# Patient Record
Sex: Male | Born: 1945 | Race: White | Hispanic: No | Marital: Married | Smoking: Former smoker
Health system: Southern US, Community
[De-identification: ages and names within clinical notes are randomized; demographics above are authoritative.]

## PROBLEM LIST (undated history)

## (undated) DIAGNOSIS — E785 Hyperlipidemia, unspecified: Secondary | ICD-10-CM

## (undated) DIAGNOSIS — K4090 Unilateral inguinal hernia, without obstruction or gangrene, not specified as recurrent: Secondary | ICD-10-CM

## (undated) DIAGNOSIS — C801 Malignant (primary) neoplasm, unspecified: Secondary | ICD-10-CM

## (undated) DIAGNOSIS — I10 Essential (primary) hypertension: Secondary | ICD-10-CM

## (undated) HISTORY — DX: Unilateral inguinal hernia, without obstruction or gangrene, not specified as recurrent: K40.90

## (undated) HISTORY — DX: Hyperlipidemia, unspecified: E78.5

## (undated) HISTORY — DX: Essential (primary) hypertension: I10

## (undated) HISTORY — DX: Malignant (primary) neoplasm, unspecified: C80.1

---

## 1995-11-09 DIAGNOSIS — C801 Malignant (primary) neoplasm, unspecified: Secondary | ICD-10-CM

## 1995-11-09 HISTORY — DX: Malignant (primary) neoplasm, unspecified: C80.1

## 1995-11-09 HISTORY — PX: MELANOMA EXCISION: SHX5266

## 2000-05-24 ENCOUNTER — Encounter: Payer: Self-pay | Admitting: Dermatology

## 2000-05-24 ENCOUNTER — Encounter: Admission: RE | Admit: 2000-05-24 | Discharge: 2000-05-24 | Payer: Self-pay | Admitting: Dermatology

## 2001-09-26 ENCOUNTER — Ambulatory Visit (HOSPITAL_COMMUNITY): Admission: RE | Admit: 2001-09-26 | Discharge: 2001-09-26 | Payer: Self-pay | Admitting: Gastroenterology

## 2010-11-08 HISTORY — PX: HERNIA REPAIR: SHX51

## 2011-09-17 ENCOUNTER — Ambulatory Visit (INDEPENDENT_AMBULATORY_CARE_PROVIDER_SITE_OTHER): Payer: Medicare Other | Admitting: General Surgery

## 2011-09-17 ENCOUNTER — Encounter (INDEPENDENT_AMBULATORY_CARE_PROVIDER_SITE_OTHER): Payer: Self-pay | Admitting: General Surgery

## 2011-09-17 VITALS — BP 142/88 | HR 60 | Temp 97.4°F | Resp 16 | Ht 71.0 in | Wt 166.1 lb

## 2011-09-17 DIAGNOSIS — K4090 Unilateral inguinal hernia, without obstruction or gangrene, not specified as recurrent: Secondary | ICD-10-CM

## 2011-09-17 NOTE — Progress Notes (Signed)
Subjective:   Hernia  Patient ID: James Thompson, male   DOB: Mar 31, 1946, 65 y.o.   MRN: [id number]  HPI Patient is a pleasant 65 year old male referred for an apparent right inguinal hernia. For approximately 2 months he has noted an intermittent bulge and occasional pain in his right groin. This seems to be related to activity and the bulge goes away at night when he rests. He has not had any previous surgery in this area. He recently saw Dr. Isabel Caprice who diagnosed an inguinal hernia on the right and he is referred. He denies any GI symptoms such as nausea, vomiting, or distention associated with the hernia.  Review of Systems  Constitutional: Negative.   HENT: Negative.   Respiratory: Negative.   Cardiovascular: Negative.   Gastrointestinal: Negative.   Genitourinary: Negative.   Hematological: Negative.        Objective:   Physical Exam General: Normal weight well-developed Caucasian male in no distress Skin: Warm and dry without rash or infection HEENT: No palpable mass or thyromegaly sclera nonicteric. Pupils equal and reactive Lymph nodes: No cervical supravesicular or inguinal nodes palpable Lungs: Clear without wheezing record regarding Cardiovascular: Regular rate and rhythm without murmur. No JVD or edema. Abdomen: Generally soft and nontender. With the patient standing and doing a Valsalva maneuver I am able to feel a moderate-sized reducible right inguinal hernia. There is no palpable hernia on the left. GU: Normal male without masses Extremities: No joint swelling or deformity Neurologic: Alert and fully oriented. Gait normal.    Assessment:     Symptomatic right inguinal hernia. We discussed the nature of his problem and potential risks such as incarceration. I think this should be repaired. We discussed the option of observation but he prefers to have it repaired. We discussed laparoscopic and open repair and I think he would be a very good candidate for laparoscopic  repair which he prefers. We discussed the nature of the procedure, recovery, risks of general anesthesia, bleeding, infection, recurrence, and chronic pain syndromes. We will schedule this at his convenience as an outpatient.    Plan:     Laparoscopic repair of right inguinal hernia under general anesthesia as an outpatient

## 2011-09-17 NOTE — Patient Instructions (Signed)
Hernia Repair with Laparoscope A hernia occurs when an internal organ pushes out through a weak spot in the belly (abdominal) wall muscles. Hernias most commonly occur in the groin and around the navel. Hernias can also occur through a cut by the surgeon (incision) after an abdominal operation. A hernia may be caused by:  Lifting heavy objects.   Prolonged coughing.   Straining to move your bowels.  Hernias can often be pushed back into place (reduced). Most hernias tend to get worse over time. Problems occur when abdominal contents get stuck in the opening and the blood supply is blocked or impaired (incarcerated hernia). Because of these risks, you require surgery to repair the hernia. Your hernia will be repaired using a laparoscope. Laparoscopic surgery is a type of minimally invasive surgery. It does not involve making a typical surgical cut (incision) in the skin. A laparoscope is a telescope-like rod and lens system. It is usually connected to a video camera and a light source so your caregiver can clearly see the operative area. The instruments are inserted through  to  inch (5 mm or 10 mm) openings in the skin at specific locations. A working and viewing space is created by blowing a small amount of carbon dioxide gas into the abdominal cavity. The abdomen is essentially blown up like a balloon (insufflated). This elevates the abdominal wall above the internal organs like a dome. The carbon dioxide gas is common to the human body and can be absorbed by tissue and removed by the respiratory system. Once the repair is completed, the small incisions will be closed with either stitches (sutures) or staples (just like a paper stapler only this staple holds the skin together). LET YOUR CAREGIVERS KNOW ABOUT:  Allergies.   Medications taken including herbs, eye drops, over the counter medications, and creams.   Use of steroids (by mouth or creams).   Previous problems with anesthetics or  Novocaine.   Possibility of pregnancy, if this applies.   History of blood clots (thrombophlebitis).   History of bleeding or blood problems.   Previous surgery.   Other health problems.  BEFORE THE PROCEDURE  Laparoscopy can be done either in a hospital or out-patient clinic. You may be given a mild sedative to help you relax before the procedure. Once in the operating room, you will be given a general anesthesia to make you sleep (unless you and your caregiver choose a different anesthetic).  AFTER THE PROCEDURE  After the procedure you will be watched in a recovery area. Depending on what type of hernia was repaired, you might be admitted to the hospital or you might go home the same day. With this procedure you may have less pain and scarring. This usually results in a quicker recovery and less risk of infection. HOME CARE INSTRUCTIONS   Bed rest is not required. You may continue your normal activities but avoid heavy lifting (more than 10 pounds) or straining.   Cough gently. If you are a smoker it is best to stop, as even the best hernia repair can break down with the continual strain of coughing.   Avoid driving until given the OK by your surgeon.   There are no dietary restrictions unless given otherwise.   TAKE ALL MEDICATIONS AS DIRECTED.   Only take over-the-counter or prescription medicines for pain, discomfort, or fever as directed by your caregiver.  SEEK MEDICAL CARE IF:   There is increasing abdominal pain or pain in your incisions.     There is more bleeding from incisions, other than minimal spotting.   You feel light headed or faint.   You develop an unexplained fever, chills, and/or an oral temperature above 102 F (38.9 C).   You have redness, swelling, or increasing pain in the wound.   Pus coming from wound.   A foul smell coming from the wound or dressings.  SEEK IMMEDIATE MEDICAL CARE IF:   You develop a rash.   You have difficulty breathing.    You have any allergic problems.  MAKE SURE YOU:   Understand these instructions.   Will watch your condition.   Will get help right away if you are not doing well or get worse.  Document Released: 10/25/2005 Document Revised: 07/07/2011 Document Reviewed: 09/24/2009 [employer] Patient Information 2012 [employer]. 

## 2011-10-11 DIAGNOSIS — K4090 Unilateral inguinal hernia, without obstruction or gangrene, not specified as recurrent: Secondary | ICD-10-CM

## 2011-11-12 ENCOUNTER — Ambulatory Visit (INDEPENDENT_AMBULATORY_CARE_PROVIDER_SITE_OTHER): Payer: Medicare Other | Admitting: General Surgery

## 2011-11-12 ENCOUNTER — Encounter (INDEPENDENT_AMBULATORY_CARE_PROVIDER_SITE_OTHER): Payer: Self-pay | Admitting: General Surgery

## 2011-11-12 VITALS — BP 132/84 | HR 68 | Temp 97.4°F | Resp 16 | Ht 71.0 in | Wt 166.2 lb

## 2011-11-12 DIAGNOSIS — Z09 Encounter for follow-up examination after completed treatment for conditions other than malignant neoplasm: Secondary | ICD-10-CM

## 2011-11-12 NOTE — Progress Notes (Signed)
Patient returns one month following laparoscopic repair of his right inguinal hernia. At this point he is feeling very well. He had several days of discomfort as expected but then quickly got back to normal activity. He has no complaints of a office today.  On examination incisions are well-healed and his hernia repair feels solid with no evidence of recurrence or other complication.  Assessment and plan: Doing well following laparoscopic inguinal hernia repair. He is essentially already back at full activity and there are no restrictions. He will return as needed.

## 2011-12-16 DIAGNOSIS — Z1211 Encounter for screening for malignant neoplasm of colon: Secondary | ICD-10-CM | POA: Diagnosis not present

## 2011-12-16 DIAGNOSIS — K5730 Diverticulosis of large intestine without perforation or abscess without bleeding: Secondary | ICD-10-CM | POA: Diagnosis not present

## 2012-03-14 DIAGNOSIS — H6120 Impacted cerumen, unspecified ear: Secondary | ICD-10-CM | POA: Diagnosis not present

## 2012-03-14 DIAGNOSIS — H9209 Otalgia, unspecified ear: Secondary | ICD-10-CM | POA: Diagnosis not present

## 2012-06-12 DIAGNOSIS — L0390 Cellulitis, unspecified: Secondary | ICD-10-CM | POA: Diagnosis not present

## 2012-06-12 DIAGNOSIS — L0291 Cutaneous abscess, unspecified: Secondary | ICD-10-CM | POA: Diagnosis not present

## 2012-06-12 DIAGNOSIS — M79609 Pain in unspecified limb: Secondary | ICD-10-CM | POA: Diagnosis not present

## 2012-07-14 ENCOUNTER — Encounter (INDEPENDENT_AMBULATORY_CARE_PROVIDER_SITE_OTHER): Payer: Self-pay | Admitting: General Surgery

## 2012-07-14 ENCOUNTER — Ambulatory Visit (INDEPENDENT_AMBULATORY_CARE_PROVIDER_SITE_OTHER): Payer: Medicare Other | Admitting: General Surgery

## 2012-07-14 VITALS — BP 118/76 | HR 68 | Temp 97.6°F | Resp 16 | Ht 71.0 in | Wt 169.0 lb

## 2012-07-14 DIAGNOSIS — K4090 Unilateral inguinal hernia, without obstruction or gangrene, not specified as recurrent: Secondary | ICD-10-CM | POA: Diagnosis not present

## 2012-07-14 NOTE — Progress Notes (Signed)
Subjective:   left inguinal hernia  Patient ID: James Thompson, male   DOB: Nov 26, 1945, 66 y.o.   MRN: [id number]  HPI Patient is a 66 year old male who returns to the office with a history of laparoscopic right inguinal hernia repair last year. He did very well from this but over the last month or 2 has developed a bulge in the right groin. This is intermittently painful. No associated GI or GU symptoms. He has never had a repair previously on the left . The right side remains asymptomatic.  Past Medical History  Diagnosis Date  . Asthma   . Hyperlipidemia   . Cancer 1997    skin cancer   . Hypertension   . Inguinal hernia    Past Surgical History  Procedure Date  . Melanoma excision 1997  . Hernia repair 2012    Right inguinal hernia repair   Current Outpatient Prescriptions  Medication Sig Dispense Refill  . aspirin 81 MG tablet Take 81 mg by mouth daily.        . bisoprolol-hydrochlorothiazide (ZIAC) 5-6.25 MG per tablet       . ezetimibe-simvastatin (VYTORIN) 10-20 MG per tablet Take 1 tablet by mouth every other day.        . Omega-3 Fatty Acids (FISH OIL) 1000 MG CAPS Take by mouth daily.         No Known Allergies History  Substance Use Topics  . Smoking status: Former Smoker    Quit date: 07/14/1973  . Smokeless tobacco: Never Used  . Alcohol Use: Yes     Review of Systems  Constitutional: Negative.   Respiratory: Negative.   Cardiovascular: Negative.   Gastrointestinal: Negative.   Genitourinary: Negative.        Objective:   Physical Exam BP 118/76  Pulse 68  Temp 97.6 F (36.4 C) (Temporal)  Resp 16  Ht 5\' 11"  (1.803 m)  Wt 169 lb (76.658 kg)  BMI 23.57 kg/m2 General: Thin well-developed male in no distress Skin: Warm and dry without rash or infection HEENT: Sclera nonicteric. Oropharynx clear. Lungs: Clear equal breath sounds bilaterally Cardiovascular: Regular rate and rhythm. No murmur. No edema Abdomen: There is a moderate sized reducible  left inguinal hernia. The right side feels solid. Well-healed laparoscopic incisions. Extremities: Duodenum or joint swelling Neurologic: Alert and oriented. Gait normal. Affect normal.    Assessment:     Nonrecurring left inguinal hernia with history of laparoscopic right inguinal hernia repair. This will need to be repaired. He did extremely well with his laparoscopic repair. I told her it would be reasonable to attempt repair of his left angle hernia laparoscopically but that due to scarring in the preperitoneal space it may be impossible to get adequate exposure safely and so would be ready to convert to open procedure. We discussed the nature of the surgery and risks of anesthetic complications, bleeding, infection, recurrence and chronic pain and all his questions were answered.    Plan:     Upper scopic repair of left inguinal hernia as an outpatient under general anesthesia

## 2012-08-28 ENCOUNTER — Telehealth (INDEPENDENT_AMBULATORY_CARE_PROVIDER_SITE_OTHER): Payer: Self-pay

## 2012-08-28 NOTE — Telephone Encounter (Signed)
The pt called to ask if he can get his flu shot today before his surgery Wednesday.  I told him it would be risky to get it this close to surgery in case there is a reaction and I asked him to wait until postop.

## 2012-08-30 DIAGNOSIS — K4090 Unilateral inguinal hernia, without obstruction or gangrene, not specified as recurrent: Secondary | ICD-10-CM | POA: Diagnosis not present

## 2012-09-01 ENCOUNTER — Telehealth (INDEPENDENT_AMBULATORY_CARE_PROVIDER_SITE_OTHER): Payer: Self-pay

## 2012-09-01 NOTE — Telephone Encounter (Signed)
Patient notified of appt on 09/29/12 @ 9:30 am w/Dr. Johna Sheriff

## 2012-09-04 DIAGNOSIS — Z23 Encounter for immunization: Secondary | ICD-10-CM | POA: Diagnosis not present

## 2012-09-15 DIAGNOSIS — H43819 Vitreous degeneration, unspecified eye: Secondary | ICD-10-CM | POA: Diagnosis not present

## 2012-09-15 DIAGNOSIS — H2510 Age-related nuclear cataract, unspecified eye: Secondary | ICD-10-CM | POA: Diagnosis not present

## 2012-09-27 ENCOUNTER — Encounter (INDEPENDENT_AMBULATORY_CARE_PROVIDER_SITE_OTHER): Payer: Medicare Other | Admitting: General Surgery

## 2012-09-29 ENCOUNTER — Ambulatory Visit (INDEPENDENT_AMBULATORY_CARE_PROVIDER_SITE_OTHER): Payer: Medicare Other | Admitting: General Surgery

## 2012-09-29 ENCOUNTER — Encounter (INDEPENDENT_AMBULATORY_CARE_PROVIDER_SITE_OTHER): Payer: Self-pay | Admitting: General Surgery

## 2012-09-29 VITALS — BP 138/82 | HR 74 | Temp 97.8°F | Resp 16 | Ht 71.0 in | Wt 171.2 lb

## 2012-09-29 DIAGNOSIS — Z09 Encounter for follow-up examination after completed treatment for conditions other than malignant neoplasm: Secondary | ICD-10-CM

## 2012-09-29 NOTE — Progress Notes (Signed)
History: Patient returned to the office the following laparoscopic repair of his left inguinal hernia. He has had a previous laparoscopic right inguinal hernia as well.  He reports he is doing well. He did not take any pain medication. He [place] notices a little bit of soreness occasionally that is resolving.  Exam: Abdomen soft and nontender. Laparoscopic wounds are well healed. The repair feels solid. There is some slight induration of the spermatic cord near the external ring.  Assessment and plan: Doing well following laparoscopic hernia repair. No complication identified. I told him the induration around his cord should resolve over several months. He will return as needed.

## 2012-10-18 DIAGNOSIS — I10 Essential (primary) hypertension: Secondary | ICD-10-CM | POA: Diagnosis not present

## 2012-10-18 DIAGNOSIS — C439 Malignant melanoma of skin, unspecified: Secondary | ICD-10-CM | POA: Diagnosis not present

## 2012-10-18 DIAGNOSIS — Z0000 Encounter for general adult medical examination without abnormal findings: Secondary | ICD-10-CM | POA: Diagnosis not present

## 2012-10-18 DIAGNOSIS — E782 Mixed hyperlipidemia: Secondary | ICD-10-CM | POA: Diagnosis not present

## 2012-10-18 DIAGNOSIS — R7309 Other abnormal glucose: Secondary | ICD-10-CM | POA: Diagnosis not present

## 2012-10-18 DIAGNOSIS — Z1331 Encounter for screening for depression: Secondary | ICD-10-CM | POA: Diagnosis not present

## 2012-10-18 DIAGNOSIS — N200 Calculus of kidney: Secondary | ICD-10-CM | POA: Diagnosis not present

## 2013-03-02 DIAGNOSIS — S0510XA Contusion of eyeball and orbital tissues, unspecified eye, initial encounter: Secondary | ICD-10-CM | POA: Diagnosis not present

## 2013-04-29 DIAGNOSIS — Z7982 Long term (current) use of aspirin: Secondary | ICD-10-CM | POA: Diagnosis not present

## 2013-04-29 DIAGNOSIS — S61409A Unspecified open wound of unspecified hand, initial encounter: Secondary | ICD-10-CM | POA: Diagnosis not present

## 2013-04-29 DIAGNOSIS — S6990XA Unspecified injury of unspecified wrist, hand and finger(s), initial encounter: Secondary | ICD-10-CM | POA: Diagnosis not present

## 2013-04-29 DIAGNOSIS — R229 Localized swelling, mass and lump, unspecified: Secondary | ICD-10-CM | POA: Diagnosis not present

## 2013-05-09 DIAGNOSIS — E785 Hyperlipidemia, unspecified: Secondary | ICD-10-CM | POA: Diagnosis not present

## 2013-05-09 DIAGNOSIS — Z4802 Encounter for removal of sutures: Secondary | ICD-10-CM | POA: Diagnosis not present

## 2013-05-09 DIAGNOSIS — I10 Essential (primary) hypertension: Secondary | ICD-10-CM | POA: Diagnosis not present

## 2013-09-02 DIAGNOSIS — Z23 Encounter for immunization: Secondary | ICD-10-CM | POA: Diagnosis not present

## 2013-10-19 DIAGNOSIS — Z1331 Encounter for screening for depression: Secondary | ICD-10-CM | POA: Diagnosis not present

## 2013-10-19 DIAGNOSIS — C439 Malignant melanoma of skin, unspecified: Secondary | ICD-10-CM | POA: Diagnosis not present

## 2013-10-19 DIAGNOSIS — Z0000 Encounter for general adult medical examination without abnormal findings: Secondary | ICD-10-CM | POA: Diagnosis not present

## 2013-10-19 DIAGNOSIS — E782 Mixed hyperlipidemia: Secondary | ICD-10-CM | POA: Diagnosis not present

## 2013-10-19 DIAGNOSIS — I10 Essential (primary) hypertension: Secondary | ICD-10-CM | POA: Diagnosis not present

## 2013-10-19 DIAGNOSIS — Z125 Encounter for screening for malignant neoplasm of prostate: Secondary | ICD-10-CM | POA: Diagnosis not present

## 2013-10-19 DIAGNOSIS — R7309 Other abnormal glucose: Secondary | ICD-10-CM | POA: Diagnosis not present

## 2013-10-19 DIAGNOSIS — N200 Calculus of kidney: Secondary | ICD-10-CM | POA: Diagnosis not present

## 2014-04-10 ENCOUNTER — Other Ambulatory Visit: Payer: Self-pay | Admitting: Internal Medicine

## 2014-04-10 ENCOUNTER — Ambulatory Visit
Admission: RE | Admit: 2014-04-10 | Discharge: 2014-04-10 | Disposition: A | Discharge status: Home / Self Care | Payer: Medicare Other | Source: Ambulatory Visit | Attending: Internal Medicine | Admitting: Internal Medicine

## 2014-04-10 ENCOUNTER — Encounter (INDEPENDENT_AMBULATORY_CARE_PROVIDER_SITE_OTHER): Payer: Self-pay

## 2014-04-10 DIAGNOSIS — M545 Low back pain, unspecified: Secondary | ICD-10-CM

## 2014-04-10 DIAGNOSIS — M5430 Sciatica, unspecified side: Secondary | ICD-10-CM | POA: Diagnosis not present

## 2014-04-10 DIAGNOSIS — M5137 Other intervertebral disc degeneration, lumbosacral region: Secondary | ICD-10-CM | POA: Diagnosis not present

## 2014-04-10 DIAGNOSIS — M47817 Spondylosis without myelopathy or radiculopathy, lumbosacral region: Secondary | ICD-10-CM | POA: Diagnosis not present

## 2014-07-31 DIAGNOSIS — Z23 Encounter for immunization: Secondary | ICD-10-CM | POA: Diagnosis not present

## 2014-09-17 DIAGNOSIS — H2513 Age-related nuclear cataract, bilateral: Secondary | ICD-10-CM | POA: Diagnosis not present

## 2014-10-23 ENCOUNTER — Other Ambulatory Visit: Payer: Self-pay | Admitting: Internal Medicine

## 2014-10-23 DIAGNOSIS — M4726 Other spondylosis with radiculopathy, lumbar region: Secondary | ICD-10-CM

## 2014-10-23 DIAGNOSIS — M545 Low back pain: Secondary | ICD-10-CM

## 2014-10-23 DIAGNOSIS — Z125 Encounter for screening for malignant neoplasm of prostate: Secondary | ICD-10-CM | POA: Diagnosis not present

## 2014-10-23 DIAGNOSIS — R7309 Other abnormal glucose: Secondary | ICD-10-CM | POA: Diagnosis not present

## 2014-10-23 DIAGNOSIS — Z0000 Encounter for general adult medical examination without abnormal findings: Secondary | ICD-10-CM | POA: Diagnosis not present

## 2014-10-23 DIAGNOSIS — C4359 Malignant melanoma of other part of trunk: Secondary | ICD-10-CM | POA: Diagnosis not present

## 2014-10-23 DIAGNOSIS — Z1389 Encounter for screening for other disorder: Secondary | ICD-10-CM | POA: Diagnosis not present

## 2014-10-23 DIAGNOSIS — I10 Essential (primary) hypertension: Secondary | ICD-10-CM | POA: Diagnosis not present

## 2014-10-23 DIAGNOSIS — E782 Mixed hyperlipidemia: Secondary | ICD-10-CM | POA: Diagnosis not present

## 2014-10-23 DIAGNOSIS — Z23 Encounter for immunization: Secondary | ICD-10-CM | POA: Diagnosis not present

## 2014-10-23 DIAGNOSIS — M5416 Radiculopathy, lumbar region: Secondary | ICD-10-CM | POA: Diagnosis not present

## 2014-10-23 DIAGNOSIS — M519 Unspecified thoracic, thoracolumbar and lumbosacral intervertebral disc disorder: Secondary | ICD-10-CM | POA: Diagnosis not present

## 2014-11-21 ENCOUNTER — Ambulatory Visit
Admission: RE | Admit: 2014-11-21 | Discharge: 2014-11-21 | Disposition: A | Discharge status: Home / Self Care | Payer: Medicare Other | Source: Ambulatory Visit | Attending: Internal Medicine | Admitting: Internal Medicine

## 2014-11-21 DIAGNOSIS — M47816 Spondylosis without myelopathy or radiculopathy, lumbar region: Secondary | ICD-10-CM | POA: Diagnosis not present

## 2014-11-21 DIAGNOSIS — M545 Low back pain: Secondary | ICD-10-CM

## 2014-11-21 DIAGNOSIS — M4726 Other spondylosis with radiculopathy, lumbar region: Secondary | ICD-10-CM

## 2014-11-21 DIAGNOSIS — M4806 Spinal stenosis, lumbar region: Secondary | ICD-10-CM | POA: Diagnosis not present

## 2014-12-06 DIAGNOSIS — M5416 Radiculopathy, lumbar region: Secondary | ICD-10-CM | POA: Diagnosis not present

## 2014-12-06 DIAGNOSIS — M4806 Spinal stenosis, lumbar region: Secondary | ICD-10-CM | POA: Diagnosis not present

## 2014-12-06 DIAGNOSIS — M4316 Spondylolisthesis, lumbar region: Secondary | ICD-10-CM | POA: Diagnosis not present

## 2014-12-24 DIAGNOSIS — M25551 Pain in right hip: Secondary | ICD-10-CM | POA: Diagnosis not present

## 2014-12-24 DIAGNOSIS — M25651 Stiffness of right hip, not elsewhere classified: Secondary | ICD-10-CM | POA: Diagnosis not present

## 2014-12-24 DIAGNOSIS — M79661 Pain in right lower leg: Secondary | ICD-10-CM | POA: Diagnosis not present

## 2014-12-24 DIAGNOSIS — M25552 Pain in left hip: Secondary | ICD-10-CM | POA: Diagnosis not present

## 2014-12-26 DIAGNOSIS — M25552 Pain in left hip: Secondary | ICD-10-CM | POA: Diagnosis not present

## 2014-12-26 DIAGNOSIS — M25651 Stiffness of right hip, not elsewhere classified: Secondary | ICD-10-CM | POA: Diagnosis not present

## 2014-12-26 DIAGNOSIS — M79661 Pain in right lower leg: Secondary | ICD-10-CM | POA: Diagnosis not present

## 2014-12-26 DIAGNOSIS — M25551 Pain in right hip: Secondary | ICD-10-CM | POA: Diagnosis not present

## 2014-12-30 DIAGNOSIS — M25551 Pain in right hip: Secondary | ICD-10-CM | POA: Diagnosis not present

## 2014-12-30 DIAGNOSIS — M25552 Pain in left hip: Secondary | ICD-10-CM | POA: Diagnosis not present

## 2014-12-30 DIAGNOSIS — M79661 Pain in right lower leg: Secondary | ICD-10-CM | POA: Diagnosis not present

## 2014-12-30 DIAGNOSIS — M25651 Stiffness of right hip, not elsewhere classified: Secondary | ICD-10-CM | POA: Diagnosis not present

## 2015-01-01 DIAGNOSIS — M25651 Stiffness of right hip, not elsewhere classified: Secondary | ICD-10-CM | POA: Diagnosis not present

## 2015-01-01 DIAGNOSIS — M79661 Pain in right lower leg: Secondary | ICD-10-CM | POA: Diagnosis not present

## 2015-01-01 DIAGNOSIS — M25552 Pain in left hip: Secondary | ICD-10-CM | POA: Diagnosis not present

## 2015-01-01 DIAGNOSIS — M25551 Pain in right hip: Secondary | ICD-10-CM | POA: Diagnosis not present

## 2015-01-03 DIAGNOSIS — M25651 Stiffness of right hip, not elsewhere classified: Secondary | ICD-10-CM | POA: Diagnosis not present

## 2015-01-03 DIAGNOSIS — M79661 Pain in right lower leg: Secondary | ICD-10-CM | POA: Diagnosis not present

## 2015-01-03 DIAGNOSIS — M25552 Pain in left hip: Secondary | ICD-10-CM | POA: Diagnosis not present

## 2015-01-03 DIAGNOSIS — M25551 Pain in right hip: Secondary | ICD-10-CM | POA: Diagnosis not present

## 2015-01-06 DIAGNOSIS — M25552 Pain in left hip: Secondary | ICD-10-CM | POA: Diagnosis not present

## 2015-01-06 DIAGNOSIS — M25651 Stiffness of right hip, not elsewhere classified: Secondary | ICD-10-CM | POA: Diagnosis not present

## 2015-01-06 DIAGNOSIS — M25551 Pain in right hip: Secondary | ICD-10-CM | POA: Diagnosis not present

## 2015-01-06 DIAGNOSIS — M79661 Pain in right lower leg: Secondary | ICD-10-CM | POA: Diagnosis not present

## 2015-01-08 DIAGNOSIS — M25551 Pain in right hip: Secondary | ICD-10-CM | POA: Diagnosis not present

## 2015-01-08 DIAGNOSIS — M25651 Stiffness of right hip, not elsewhere classified: Secondary | ICD-10-CM | POA: Diagnosis not present

## 2015-01-08 DIAGNOSIS — M25552 Pain in left hip: Secondary | ICD-10-CM | POA: Diagnosis not present

## 2015-01-08 DIAGNOSIS — M79661 Pain in right lower leg: Secondary | ICD-10-CM | POA: Diagnosis not present

## 2015-01-10 DIAGNOSIS — M25551 Pain in right hip: Secondary | ICD-10-CM | POA: Diagnosis not present

## 2015-01-10 DIAGNOSIS — M79661 Pain in right lower leg: Secondary | ICD-10-CM | POA: Diagnosis not present

## 2015-01-10 DIAGNOSIS — M25651 Stiffness of right hip, not elsewhere classified: Secondary | ICD-10-CM | POA: Diagnosis not present

## 2015-01-10 DIAGNOSIS — M25552 Pain in left hip: Secondary | ICD-10-CM | POA: Diagnosis not present

## 2015-01-20 DIAGNOSIS — M25651 Stiffness of right hip, not elsewhere classified: Secondary | ICD-10-CM | POA: Diagnosis not present

## 2015-01-20 DIAGNOSIS — M25551 Pain in right hip: Secondary | ICD-10-CM | POA: Diagnosis not present

## 2015-01-20 DIAGNOSIS — M79661 Pain in right lower leg: Secondary | ICD-10-CM | POA: Diagnosis not present

## 2015-01-20 DIAGNOSIS — M25552 Pain in left hip: Secondary | ICD-10-CM | POA: Diagnosis not present

## 2015-01-22 DIAGNOSIS — M25551 Pain in right hip: Secondary | ICD-10-CM | POA: Diagnosis not present

## 2015-01-22 DIAGNOSIS — M25651 Stiffness of right hip, not elsewhere classified: Secondary | ICD-10-CM | POA: Diagnosis not present

## 2015-01-22 DIAGNOSIS — M79661 Pain in right lower leg: Secondary | ICD-10-CM | POA: Diagnosis not present

## 2015-01-22 DIAGNOSIS — M25552 Pain in left hip: Secondary | ICD-10-CM | POA: Diagnosis not present

## 2015-01-24 DIAGNOSIS — M25552 Pain in left hip: Secondary | ICD-10-CM | POA: Diagnosis not present

## 2015-01-24 DIAGNOSIS — M79661 Pain in right lower leg: Secondary | ICD-10-CM | POA: Diagnosis not present

## 2015-01-24 DIAGNOSIS — M25551 Pain in right hip: Secondary | ICD-10-CM | POA: Diagnosis not present

## 2015-01-24 DIAGNOSIS — M25651 Stiffness of right hip, not elsewhere classified: Secondary | ICD-10-CM | POA: Diagnosis not present

## 2015-01-28 DIAGNOSIS — M25552 Pain in left hip: Secondary | ICD-10-CM | POA: Diagnosis not present

## 2015-01-28 DIAGNOSIS — M25651 Stiffness of right hip, not elsewhere classified: Secondary | ICD-10-CM | POA: Diagnosis not present

## 2015-01-28 DIAGNOSIS — M25551 Pain in right hip: Secondary | ICD-10-CM | POA: Diagnosis not present

## 2015-01-28 DIAGNOSIS — M79661 Pain in right lower leg: Secondary | ICD-10-CM | POA: Diagnosis not present

## 2015-01-30 DIAGNOSIS — M79661 Pain in right lower leg: Secondary | ICD-10-CM | POA: Diagnosis not present

## 2015-01-30 DIAGNOSIS — M25551 Pain in right hip: Secondary | ICD-10-CM | POA: Diagnosis not present

## 2015-01-30 DIAGNOSIS — M25552 Pain in left hip: Secondary | ICD-10-CM | POA: Diagnosis not present

## 2015-01-30 DIAGNOSIS — M25651 Stiffness of right hip, not elsewhere classified: Secondary | ICD-10-CM | POA: Diagnosis not present

## 2015-02-14 DIAGNOSIS — E785 Hyperlipidemia, unspecified: Secondary | ICD-10-CM | POA: Diagnosis not present

## 2015-05-27 DIAGNOSIS — R030 Elevated blood-pressure reading, without diagnosis of hypertension: Secondary | ICD-10-CM | POA: Diagnosis not present

## 2015-05-27 DIAGNOSIS — M4806 Spinal stenosis, lumbar region: Secondary | ICD-10-CM | POA: Diagnosis not present

## 2015-05-27 DIAGNOSIS — M545 Low back pain: Secondary | ICD-10-CM | POA: Diagnosis not present

## 2015-05-27 DIAGNOSIS — M4316 Spondylolisthesis, lumbar region: Secondary | ICD-10-CM | POA: Diagnosis not present

## 2015-05-27 DIAGNOSIS — Z6825 Body mass index (BMI) 25.0-25.9, adult: Secondary | ICD-10-CM | POA: Diagnosis not present

## 2015-08-26 DIAGNOSIS — Z23 Encounter for immunization: Secondary | ICD-10-CM | POA: Diagnosis not present

## 2015-11-18 DIAGNOSIS — C4359 Malignant melanoma of other part of trunk: Secondary | ICD-10-CM | POA: Diagnosis not present

## 2015-11-18 DIAGNOSIS — I10 Essential (primary) hypertension: Secondary | ICD-10-CM | POA: Diagnosis not present

## 2015-11-18 DIAGNOSIS — Z0000 Encounter for general adult medical examination without abnormal findings: Secondary | ICD-10-CM | POA: Diagnosis not present

## 2015-11-18 DIAGNOSIS — M4806 Spinal stenosis, lumbar region: Secondary | ICD-10-CM | POA: Diagnosis not present

## 2015-11-18 DIAGNOSIS — N200 Calculus of kidney: Secondary | ICD-10-CM | POA: Diagnosis not present

## 2015-11-18 DIAGNOSIS — E782 Mixed hyperlipidemia: Secondary | ICD-10-CM | POA: Diagnosis not present

## 2015-11-18 DIAGNOSIS — M519 Unspecified thoracic, thoracolumbar and lumbosacral intervertebral disc disorder: Secondary | ICD-10-CM | POA: Diagnosis not present

## 2015-11-18 DIAGNOSIS — Z125 Encounter for screening for malignant neoplasm of prostate: Secondary | ICD-10-CM | POA: Diagnosis not present

## 2015-11-18 DIAGNOSIS — R7309 Other abnormal glucose: Secondary | ICD-10-CM | POA: Diagnosis not present

## 2015-11-18 DIAGNOSIS — Z1389 Encounter for screening for other disorder: Secondary | ICD-10-CM | POA: Diagnosis not present

## 2015-11-25 DIAGNOSIS — M545 Low back pain: Secondary | ICD-10-CM | POA: Diagnosis not present

## 2015-11-25 DIAGNOSIS — M5416 Radiculopathy, lumbar region: Secondary | ICD-10-CM | POA: Diagnosis not present

## 2015-11-25 DIAGNOSIS — M4806 Spinal stenosis, lumbar region: Secondary | ICD-10-CM | POA: Diagnosis not present

## 2015-11-25 DIAGNOSIS — Z6826 Body mass index (BMI) 26.0-26.9, adult: Secondary | ICD-10-CM | POA: Diagnosis not present

## 2015-11-25 DIAGNOSIS — I10 Essential (primary) hypertension: Secondary | ICD-10-CM | POA: Diagnosis not present

## 2016-01-07 DIAGNOSIS — M79672 Pain in left foot: Secondary | ICD-10-CM | POA: Diagnosis not present

## 2016-01-08 ENCOUNTER — Ambulatory Visit
Admission: RE | Admit: 2016-01-08 | Discharge: 2016-01-08 | Disposition: A | Discharge status: Home / Self Care | Payer: Medicare Other | Source: Ambulatory Visit | Attending: Internal Medicine | Admitting: Internal Medicine

## 2016-01-08 ENCOUNTER — Other Ambulatory Visit: Payer: Self-pay | Admitting: Internal Medicine

## 2016-01-08 DIAGNOSIS — M79671 Pain in right foot: Secondary | ICD-10-CM | POA: Diagnosis not present

## 2016-01-08 DIAGNOSIS — M79672 Pain in left foot: Secondary | ICD-10-CM

## 2016-02-27 DIAGNOSIS — M7671 Peroneal tendinitis, right leg: Secondary | ICD-10-CM | POA: Diagnosis not present

## 2016-05-25 DIAGNOSIS — M4316 Spondylolisthesis, lumbar region: Secondary | ICD-10-CM | POA: Diagnosis not present

## 2016-07-30 DIAGNOSIS — Z23 Encounter for immunization: Secondary | ICD-10-CM | POA: Diagnosis not present

## 2016-09-10 DIAGNOSIS — L03011 Cellulitis of right finger: Secondary | ICD-10-CM | POA: Diagnosis not present

## 2016-09-10 DIAGNOSIS — L03012 Cellulitis of left finger: Secondary | ICD-10-CM | POA: Diagnosis not present

## 2016-09-20 DIAGNOSIS — L03011 Cellulitis of right finger: Secondary | ICD-10-CM | POA: Diagnosis not present

## 2016-10-12 DIAGNOSIS — H2513 Age-related nuclear cataract, bilateral: Secondary | ICD-10-CM | POA: Diagnosis not present

## 2016-12-04 DIAGNOSIS — J101 Influenza due to other identified influenza virus with other respiratory manifestations: Secondary | ICD-10-CM | POA: Diagnosis not present

## 2016-12-16 DIAGNOSIS — R7309 Other abnormal glucose: Secondary | ICD-10-CM | POA: Diagnosis not present

## 2016-12-16 DIAGNOSIS — Z1159 Encounter for screening for other viral diseases: Secondary | ICD-10-CM | POA: Diagnosis not present

## 2016-12-16 DIAGNOSIS — Z1389 Encounter for screening for other disorder: Secondary | ICD-10-CM | POA: Diagnosis not present

## 2016-12-16 DIAGNOSIS — Z0000 Encounter for general adult medical examination without abnormal findings: Secondary | ICD-10-CM | POA: Diagnosis not present

## 2016-12-16 DIAGNOSIS — Z125 Encounter for screening for malignant neoplasm of prostate: Secondary | ICD-10-CM | POA: Diagnosis not present

## 2016-12-16 DIAGNOSIS — I10 Essential (primary) hypertension: Secondary | ICD-10-CM | POA: Diagnosis not present

## 2016-12-16 DIAGNOSIS — E782 Mixed hyperlipidemia: Secondary | ICD-10-CM | POA: Diagnosis not present

## 2016-12-16 DIAGNOSIS — C4359 Malignant melanoma of other part of trunk: Secondary | ICD-10-CM | POA: Diagnosis not present

## 2017-06-22 DIAGNOSIS — H6123 Impacted cerumen, bilateral: Secondary | ICD-10-CM | POA: Diagnosis not present

## 2017-06-22 DIAGNOSIS — H9191 Unspecified hearing loss, right ear: Secondary | ICD-10-CM | POA: Diagnosis not present

## 2017-07-14 ENCOUNTER — Other Ambulatory Visit: Payer: Self-pay | Admitting: Internal Medicine

## 2017-07-14 ENCOUNTER — Ambulatory Visit
Admission: RE | Admit: 2017-07-14 | Discharge: 2017-07-14 | Disposition: A | Discharge status: Home / Self Care | Payer: Medicare Other | Source: Ambulatory Visit | Attending: Internal Medicine | Admitting: Internal Medicine

## 2017-07-14 DIAGNOSIS — R05 Cough: Secondary | ICD-10-CM

## 2017-07-14 DIAGNOSIS — J209 Acute bronchitis, unspecified: Secondary | ICD-10-CM | POA: Diagnosis not present

## 2017-07-14 DIAGNOSIS — R059 Cough, unspecified: Secondary | ICD-10-CM

## 2017-07-21 DIAGNOSIS — R05 Cough: Secondary | ICD-10-CM | POA: Diagnosis not present

## 2017-08-03 DIAGNOSIS — Z23 Encounter for immunization: Secondary | ICD-10-CM | POA: Diagnosis not present

## 2017-12-16 DIAGNOSIS — E782 Mixed hyperlipidemia: Secondary | ICD-10-CM | POA: Diagnosis not present

## 2017-12-16 DIAGNOSIS — M48061 Spinal stenosis, lumbar region without neurogenic claudication: Secondary | ICD-10-CM | POA: Diagnosis not present

## 2017-12-16 DIAGNOSIS — Z1389 Encounter for screening for other disorder: Secondary | ICD-10-CM | POA: Diagnosis not present

## 2017-12-16 DIAGNOSIS — Z0000 Encounter for general adult medical examination without abnormal findings: Secondary | ICD-10-CM | POA: Diagnosis not present

## 2017-12-16 DIAGNOSIS — Z125 Encounter for screening for malignant neoplasm of prostate: Secondary | ICD-10-CM | POA: Diagnosis not present

## 2017-12-16 DIAGNOSIS — I10 Essential (primary) hypertension: Secondary | ICD-10-CM | POA: Diagnosis not present

## 2017-12-16 DIAGNOSIS — C4359 Malignant melanoma of other part of trunk: Secondary | ICD-10-CM | POA: Diagnosis not present

## 2017-12-16 DIAGNOSIS — R739 Hyperglycemia, unspecified: Secondary | ICD-10-CM | POA: Diagnosis not present

## 2018-08-24 DIAGNOSIS — Z23 Encounter for immunization: Secondary | ICD-10-CM | POA: Diagnosis not present

## 2018-10-11 DIAGNOSIS — H10413 Chronic giant papillary conjunctivitis, bilateral: Secondary | ICD-10-CM | POA: Diagnosis not present

## 2018-10-11 DIAGNOSIS — H11823 Conjunctivochalasis, bilateral: Secondary | ICD-10-CM | POA: Diagnosis not present

## 2018-10-11 DIAGNOSIS — H25813 Combined forms of age-related cataract, bilateral: Secondary | ICD-10-CM | POA: Diagnosis not present

## 2018-12-18 ENCOUNTER — Other Ambulatory Visit: Payer: Self-pay | Admitting: Internal Medicine

## 2018-12-18 ENCOUNTER — Ambulatory Visit
Admission: RE | Admit: 2018-12-18 | Discharge: 2018-12-18 | Disposition: A | Discharge status: Home / Self Care | Payer: Medicare Other | Source: Ambulatory Visit | Attending: Internal Medicine | Admitting: Internal Medicine

## 2018-12-18 DIAGNOSIS — R7303 Prediabetes: Secondary | ICD-10-CM | POA: Diagnosis not present

## 2018-12-18 DIAGNOSIS — M519 Unspecified thoracic, thoracolumbar and lumbosacral intervertebral disc disorder: Secondary | ICD-10-CM | POA: Diagnosis not present

## 2018-12-18 DIAGNOSIS — Z125 Encounter for screening for malignant neoplasm of prostate: Secondary | ICD-10-CM | POA: Diagnosis not present

## 2018-12-18 DIAGNOSIS — Z23 Encounter for immunization: Secondary | ICD-10-CM | POA: Diagnosis not present

## 2018-12-18 DIAGNOSIS — E782 Mixed hyperlipidemia: Secondary | ICD-10-CM | POA: Diagnosis not present

## 2018-12-18 DIAGNOSIS — M1711 Unilateral primary osteoarthritis, right knee: Secondary | ICD-10-CM | POA: Diagnosis not present

## 2018-12-18 DIAGNOSIS — Z1389 Encounter for screening for other disorder: Secondary | ICD-10-CM | POA: Diagnosis not present

## 2018-12-18 DIAGNOSIS — M25561 Pain in right knee: Secondary | ICD-10-CM

## 2018-12-18 DIAGNOSIS — I10 Essential (primary) hypertension: Secondary | ICD-10-CM | POA: Diagnosis not present

## 2018-12-18 DIAGNOSIS — Z0000 Encounter for general adult medical examination without abnormal findings: Secondary | ICD-10-CM | POA: Diagnosis not present

## 2018-12-18 DIAGNOSIS — C4359 Malignant melanoma of other part of trunk: Secondary | ICD-10-CM | POA: Diagnosis not present

## 2018-12-25 DIAGNOSIS — M25561 Pain in right knee: Secondary | ICD-10-CM | POA: Diagnosis not present

## 2019-05-15 DIAGNOSIS — M25561 Pain in right knee: Secondary | ICD-10-CM | POA: Diagnosis not present

## 2019-06-19 IMAGING — DX DG KNEE 1-2V*R*
2 series · 2 of 2 positions shown · non-contrast
Comparison: None.

CLINICAL DATA: Chronic right knee pain.  No injury.

EXAM:
RIGHT KNEE - 1-2 VIEW

[dg knee 1-2 views right (1 of 2)]
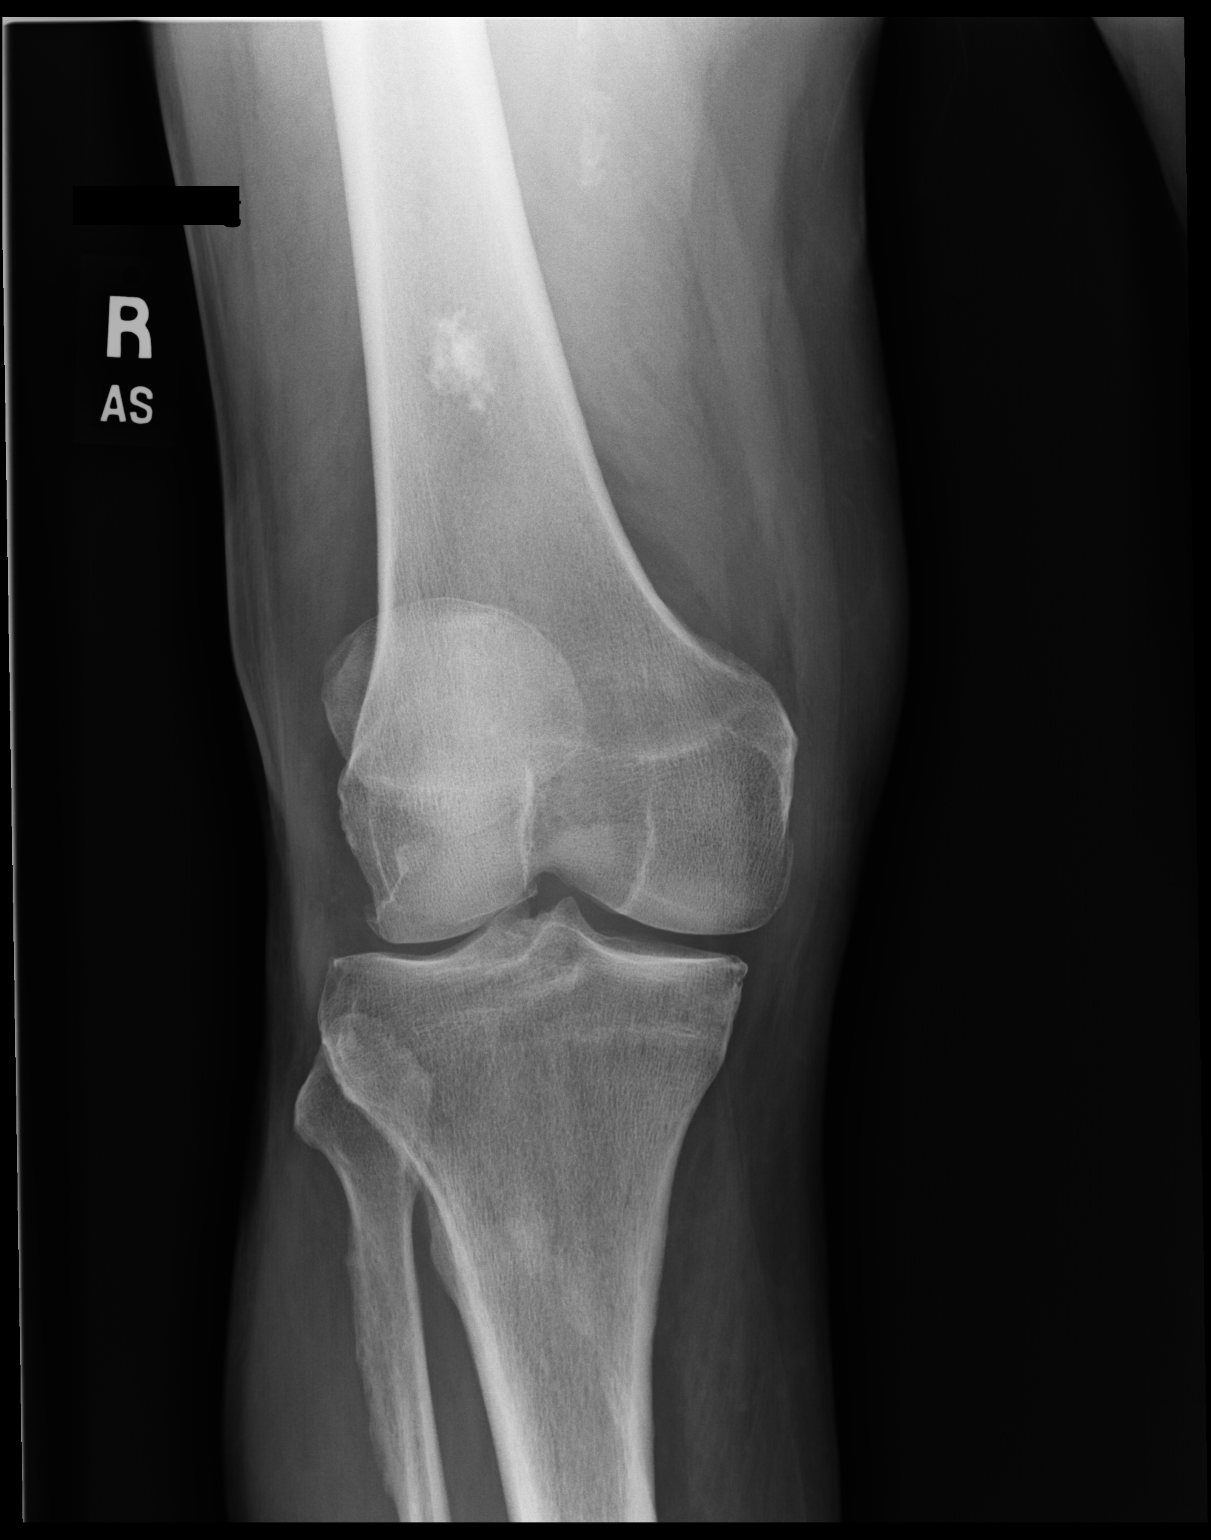

[dg knee 1-2 views right (2 of 2)]
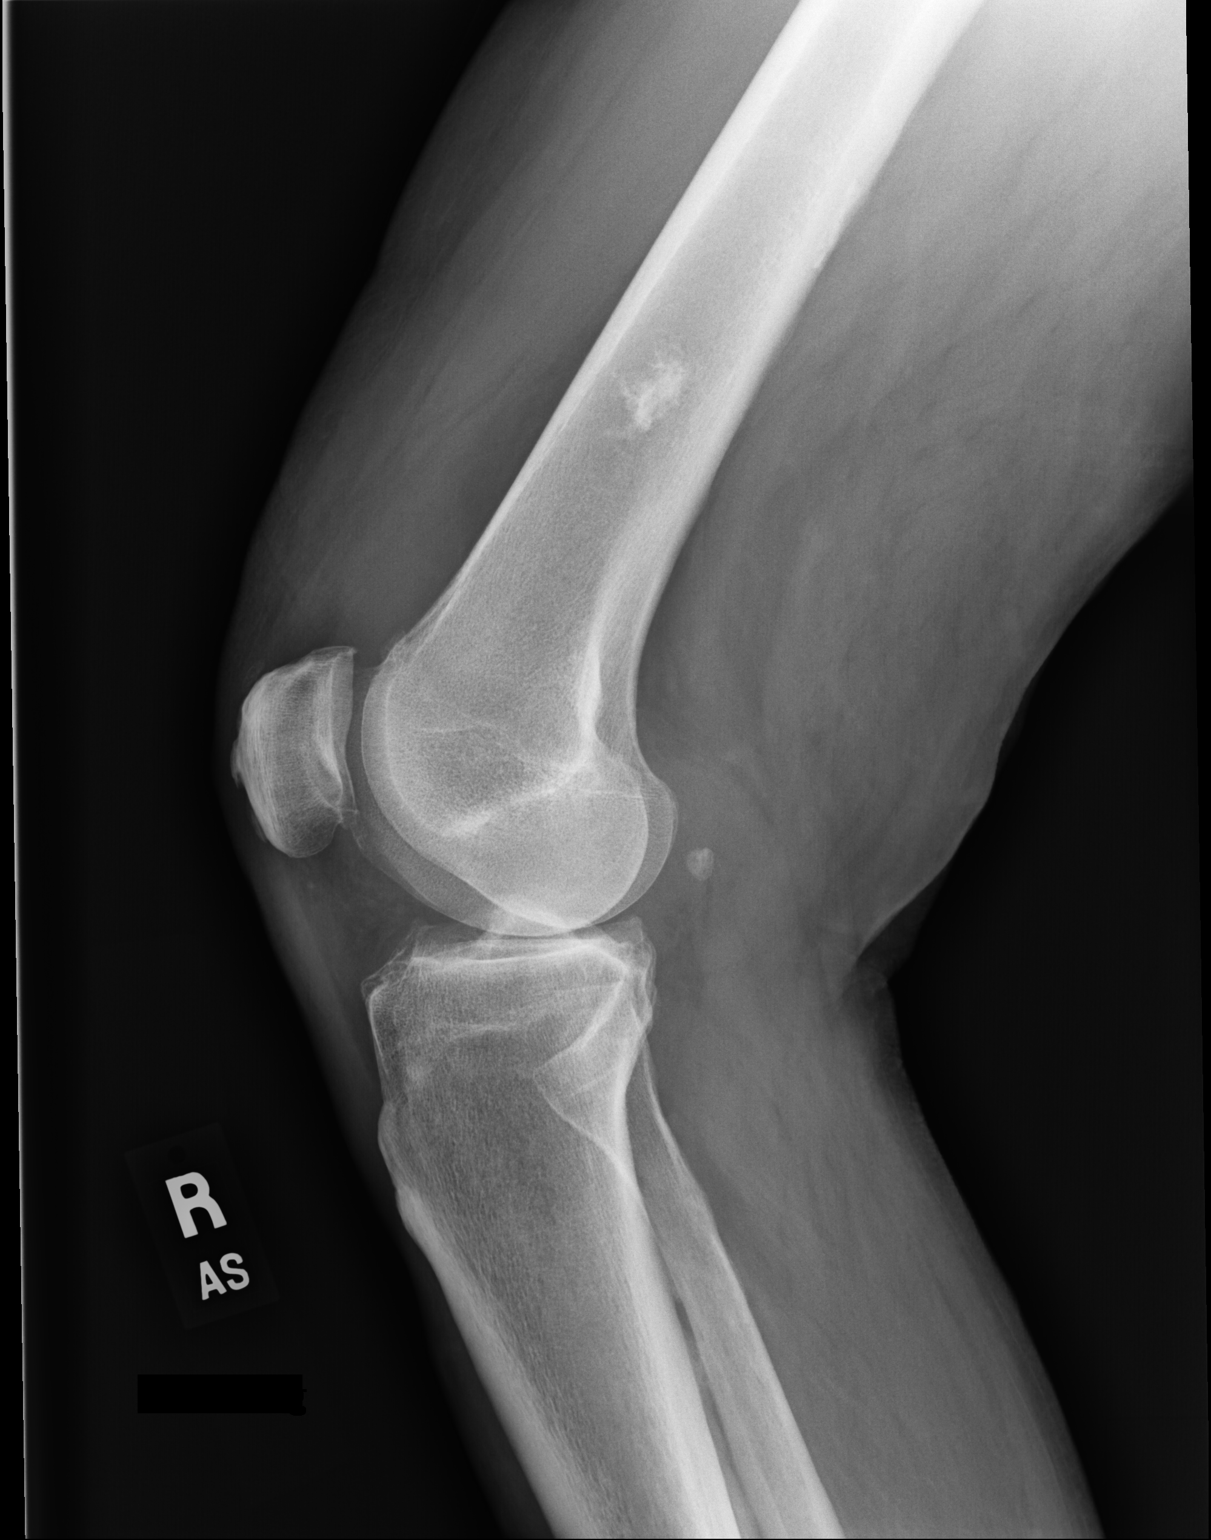

[2 of 2 positions shown; findings below may reference images not displayed]

FINDINGS: Irregular sclerosis in the distal femoral diaphysis is consistent
with an enchondroma or previous infarct. No other bony lesions. No
fracture or dislocation. A small joint effusion is noted. Mild
tricompartmental degenerative changes are seen.
IMPRESSION: 1. Mild tricompartmental degenerative changes. Small joint effusion.
No fracture.
2. Enchondroma versus chronic infarct in the distal femoral
diaphysis.

## 2019-08-07 DIAGNOSIS — Z23 Encounter for immunization: Secondary | ICD-10-CM | POA: Diagnosis not present

## 2019-12-04 ENCOUNTER — Ambulatory Visit: Payer: Medicare Other

## 2019-12-09 ENCOUNTER — Ambulatory Visit: Payer: Medicare Other

## 2019-12-13 ENCOUNTER — Ambulatory Visit: Payer: Medicare Other | Attending: Internal Medicine

## 2019-12-13 DIAGNOSIS — Z23 Encounter for immunization: Secondary | ICD-10-CM | POA: Insufficient documentation

## 2019-12-13 NOTE — Progress Notes (Signed)
   Covid-19 Vaccination Clinic  Name:  James Thompson    MRN: [id number] DOB: Aug 08, 1946  12/13/2019  Mr. Stanislawski was observed post Covid-19 immunization for 15 minutes without incidence. He was provided with Vaccine Information Sheet and instruction to access the V-Safe system.   Mr. Sahni was instructed to call 911 with any severe reactions post vaccine: Marland Kitchen Difficulty breathing  . Swelling of your face and throat  . A fast heartbeat  . A bad rash all over your body  . Dizziness and weakness    Immunizations Administered    Name Date Dose VIS Date Route   Pfizer COVID-19 Vaccine 12/13/2019  3:18 PM 0.3 mL 10/19/2019 Intramuscular   Manufacturer: [place]   Lot: [id number]   [place]: [id number]

## 2019-12-20 ENCOUNTER — Ambulatory Visit: Payer: Medicare Other

## 2019-12-21 ENCOUNTER — Ambulatory Visit: Payer: Medicare Other

## 2020-01-08 ENCOUNTER — Ambulatory Visit: Payer: Medicare Other | Attending: Internal Medicine

## 2020-01-08 DIAGNOSIS — Z23 Encounter for immunization: Secondary | ICD-10-CM

## 2020-01-08 NOTE — Progress Notes (Signed)
   Covid-19 Vaccination Clinic  Name:  James Thompson    MRN: [id number] DOB: [birth year]  01/08/2020  Mr. James Thompson was observed post Covid-19 immunization for 15 minutes without incident. He was provided with Vaccine Information Sheet and instruction to access the V-Safe system.   Mr. James Thompson was instructed to call 911 with any severe reactions post vaccine: Marland Kitchen Difficulty breathing  . Swelling of face and throat  . A fast heartbeat  . A bad rash all over body  . Dizziness and weakness   Immunizations Administered    Name Date Dose VIS Date Route   Pfizer COVID-19 Vaccine 01/08/2020 11:06 AM 0.3 mL 10/19/2019 Intramuscular   Manufacturer: [place]   Lot: [id number]   [place]: [id number]

## 2020-02-18 DIAGNOSIS — R7309 Other abnormal glucose: Secondary | ICD-10-CM | POA: Diagnosis not present

## 2020-02-18 DIAGNOSIS — Z0000 Encounter for general adult medical examination without abnormal findings: Secondary | ICD-10-CM | POA: Diagnosis not present

## 2020-02-18 DIAGNOSIS — J309 Allergic rhinitis, unspecified: Secondary | ICD-10-CM | POA: Diagnosis not present

## 2020-02-18 DIAGNOSIS — Z125 Encounter for screening for malignant neoplasm of prostate: Secondary | ICD-10-CM | POA: Diagnosis not present

## 2020-02-18 DIAGNOSIS — Z1389 Encounter for screening for other disorder: Secondary | ICD-10-CM | POA: Diagnosis not present

## 2020-02-18 DIAGNOSIS — I10 Essential (primary) hypertension: Secondary | ICD-10-CM | POA: Diagnosis not present

## 2020-02-18 DIAGNOSIS — C4359 Malignant melanoma of other part of trunk: Secondary | ICD-10-CM | POA: Diagnosis not present

## 2020-02-18 DIAGNOSIS — E782 Mixed hyperlipidemia: Secondary | ICD-10-CM | POA: Diagnosis not present

## 2020-07-29 DIAGNOSIS — M25561 Pain in right knee: Secondary | ICD-10-CM | POA: Diagnosis not present

## 2020-08-06 DIAGNOSIS — Z23 Encounter for immunization: Secondary | ICD-10-CM | POA: Diagnosis not present

## 2020-08-14 DIAGNOSIS — M25561 Pain in right knee: Secondary | ICD-10-CM | POA: Diagnosis not present

## 2020-08-20 DIAGNOSIS — M25561 Pain in right knee: Secondary | ICD-10-CM | POA: Diagnosis not present

## 2020-08-21 DIAGNOSIS — Z23 Encounter for immunization: Secondary | ICD-10-CM | POA: Diagnosis not present

## 2020-11-17 DIAGNOSIS — H04123 Dry eye syndrome of bilateral lacrimal glands: Secondary | ICD-10-CM | POA: Diagnosis not present

## 2020-11-17 DIAGNOSIS — H25813 Combined forms of age-related cataract, bilateral: Secondary | ICD-10-CM | POA: Diagnosis not present

## 2020-11-17 DIAGNOSIS — H1045 Other chronic allergic conjunctivitis: Secondary | ICD-10-CM | POA: Diagnosis not present

## 2020-11-21 DIAGNOSIS — N200 Calculus of kidney: Secondary | ICD-10-CM | POA: Diagnosis not present

## 2020-11-21 DIAGNOSIS — R300 Dysuria: Secondary | ICD-10-CM | POA: Diagnosis not present

## 2020-11-21 DIAGNOSIS — R3915 Urgency of urination: Secondary | ICD-10-CM | POA: Diagnosis not present

## 2020-11-21 DIAGNOSIS — R110 Nausea: Secondary | ICD-10-CM | POA: Diagnosis not present

## 2021-01-28 DIAGNOSIS — Z85828 Personal history of other malignant neoplasm of skin: Secondary | ICD-10-CM | POA: Diagnosis not present

## 2021-01-28 DIAGNOSIS — L570 Actinic keratosis: Secondary | ICD-10-CM | POA: Diagnosis not present

## 2021-01-28 DIAGNOSIS — Z85820 Personal history of malignant melanoma of skin: Secondary | ICD-10-CM | POA: Diagnosis not present

## 2021-01-28 DIAGNOSIS — L400 Psoriasis vulgaris: Secondary | ICD-10-CM | POA: Diagnosis not present

## 2021-02-19 DIAGNOSIS — M48061 Spinal stenosis, lumbar region without neurogenic claudication: Secondary | ICD-10-CM | POA: Diagnosis not present

## 2021-02-19 DIAGNOSIS — Z1389 Encounter for screening for other disorder: Secondary | ICD-10-CM | POA: Diagnosis not present

## 2021-02-19 DIAGNOSIS — Z125 Encounter for screening for malignant neoplasm of prostate: Secondary | ICD-10-CM | POA: Diagnosis not present

## 2021-02-19 DIAGNOSIS — I10 Essential (primary) hypertension: Secondary | ICD-10-CM | POA: Diagnosis not present

## 2021-02-19 DIAGNOSIS — E782 Mixed hyperlipidemia: Secondary | ICD-10-CM | POA: Diagnosis not present

## 2021-02-19 DIAGNOSIS — R7303 Prediabetes: Secondary | ICD-10-CM | POA: Diagnosis not present

## 2021-02-19 DIAGNOSIS — B351 Tinea unguium: Secondary | ICD-10-CM | POA: Diagnosis not present

## 2021-02-19 DIAGNOSIS — N200 Calculus of kidney: Secondary | ICD-10-CM | POA: Diagnosis not present

## 2021-02-19 DIAGNOSIS — Z0000 Encounter for general adult medical examination without abnormal findings: Secondary | ICD-10-CM | POA: Diagnosis not present

## 2021-02-19 DIAGNOSIS — C4359 Malignant melanoma of other part of trunk: Secondary | ICD-10-CM | POA: Diagnosis not present

## 2021-02-19 DIAGNOSIS — J309 Allergic rhinitis, unspecified: Secondary | ICD-10-CM | POA: Diagnosis not present

## 2021-08-21 DIAGNOSIS — Z23 Encounter for immunization: Secondary | ICD-10-CM | POA: Diagnosis not present

## 2021-09-10 DIAGNOSIS — Z23 Encounter for immunization: Secondary | ICD-10-CM | POA: Diagnosis not present

## 2021-11-17 DIAGNOSIS — H25813 Combined forms of age-related cataract, bilateral: Secondary | ICD-10-CM | POA: Diagnosis not present

## 2021-11-17 DIAGNOSIS — H1045 Other chronic allergic conjunctivitis: Secondary | ICD-10-CM | POA: Diagnosis not present

## 2021-11-17 DIAGNOSIS — H04123 Dry eye syndrome of bilateral lacrimal glands: Secondary | ICD-10-CM | POA: Diagnosis not present

## 2021-11-26 DIAGNOSIS — Z85828 Personal history of other malignant neoplasm of skin: Secondary | ICD-10-CM | POA: Diagnosis not present

## 2021-11-26 DIAGNOSIS — D485 Neoplasm of uncertain behavior of skin: Secondary | ICD-10-CM | POA: Diagnosis not present

## 2021-11-26 DIAGNOSIS — D044 Carcinoma in situ of skin of scalp and neck: Secondary | ICD-10-CM | POA: Diagnosis not present

## 2021-11-26 DIAGNOSIS — Z85820 Personal history of malignant melanoma of skin: Secondary | ICD-10-CM | POA: Diagnosis not present

## 2021-11-26 DIAGNOSIS — D0439 Carcinoma in situ of skin of other parts of face: Secondary | ICD-10-CM | POA: Diagnosis not present

## 2021-12-29 DIAGNOSIS — Z85820 Personal history of malignant melanoma of skin: Secondary | ICD-10-CM | POA: Diagnosis not present

## 2021-12-29 DIAGNOSIS — Z85828 Personal history of other malignant neoplasm of skin: Secondary | ICD-10-CM | POA: Diagnosis not present

## 2021-12-29 DIAGNOSIS — C44329 Squamous cell carcinoma of skin of other parts of face: Secondary | ICD-10-CM | POA: Diagnosis not present

## 2022-01-25 DIAGNOSIS — L570 Actinic keratosis: Secondary | ICD-10-CM | POA: Diagnosis not present

## 2022-01-25 DIAGNOSIS — Z85820 Personal history of malignant melanoma of skin: Secondary | ICD-10-CM | POA: Diagnosis not present

## 2022-01-25 DIAGNOSIS — D692 Other nonthrombocytopenic purpura: Secondary | ICD-10-CM | POA: Diagnosis not present

## 2022-01-25 DIAGNOSIS — Z85828 Personal history of other malignant neoplasm of skin: Secondary | ICD-10-CM | POA: Diagnosis not present

## 2022-01-25 DIAGNOSIS — L821 Other seborrheic keratosis: Secondary | ICD-10-CM | POA: Diagnosis not present

## 2022-02-22 DIAGNOSIS — Z1211 Encounter for screening for malignant neoplasm of colon: Secondary | ICD-10-CM | POA: Diagnosis not present

## 2022-02-22 DIAGNOSIS — E782 Mixed hyperlipidemia: Secondary | ICD-10-CM | POA: Diagnosis not present

## 2022-02-22 DIAGNOSIS — N200 Calculus of kidney: Secondary | ICD-10-CM | POA: Diagnosis not present

## 2022-02-22 DIAGNOSIS — Z125 Encounter for screening for malignant neoplasm of prostate: Secondary | ICD-10-CM | POA: Diagnosis not present

## 2022-02-22 DIAGNOSIS — J309 Allergic rhinitis, unspecified: Secondary | ICD-10-CM | POA: Diagnosis not present

## 2022-02-22 DIAGNOSIS — Z0000 Encounter for general adult medical examination without abnormal findings: Secondary | ICD-10-CM | POA: Diagnosis not present

## 2022-02-22 DIAGNOSIS — C4359 Malignant melanoma of other part of trunk: Secondary | ICD-10-CM | POA: Diagnosis not present

## 2022-02-22 DIAGNOSIS — M519 Unspecified thoracic, thoracolumbar and lumbosacral intervertebral disc disorder: Secondary | ICD-10-CM | POA: Diagnosis not present

## 2022-02-22 DIAGNOSIS — R7303 Prediabetes: Secondary | ICD-10-CM | POA: Diagnosis not present

## 2022-02-22 DIAGNOSIS — I10 Essential (primary) hypertension: Secondary | ICD-10-CM | POA: Diagnosis not present

## 2022-03-24 DIAGNOSIS — L905 Scar conditions and fibrosis of skin: Secondary | ICD-10-CM | POA: Diagnosis not present

## 2022-03-24 DIAGNOSIS — Z85820 Personal history of malignant melanoma of skin: Secondary | ICD-10-CM | POA: Diagnosis not present

## 2022-03-24 DIAGNOSIS — Z85828 Personal history of other malignant neoplasm of skin: Secondary | ICD-10-CM | POA: Diagnosis not present

## 2022-07-27 DIAGNOSIS — Z23 Encounter for immunization: Secondary | ICD-10-CM | POA: Diagnosis not present

## 2022-09-02 DIAGNOSIS — U071 COVID-19: Secondary | ICD-10-CM | POA: Diagnosis not present

## 2022-09-02 DIAGNOSIS — J029 Acute pharyngitis, unspecified: Secondary | ICD-10-CM | POA: Diagnosis not present

## 2022-10-06 DIAGNOSIS — Z23 Encounter for immunization: Secondary | ICD-10-CM | POA: Diagnosis not present

## 2022-10-11 DIAGNOSIS — L570 Actinic keratosis: Secondary | ICD-10-CM | POA: Diagnosis not present

## 2022-10-11 DIAGNOSIS — D2272 Melanocytic nevi of left lower limb, including hip: Secondary | ICD-10-CM | POA: Diagnosis not present

## 2022-10-11 DIAGNOSIS — L820 Inflamed seborrheic keratosis: Secondary | ICD-10-CM | POA: Diagnosis not present

## 2022-10-11 DIAGNOSIS — L812 Freckles: Secondary | ICD-10-CM | POA: Diagnosis not present

## 2022-10-11 DIAGNOSIS — Z85828 Personal history of other malignant neoplasm of skin: Secondary | ICD-10-CM | POA: Diagnosis not present

## 2022-10-11 DIAGNOSIS — Z85820 Personal history of malignant melanoma of skin: Secondary | ICD-10-CM | POA: Diagnosis not present

## 2022-11-23 DIAGNOSIS — H25813 Combined forms of age-related cataract, bilateral: Secondary | ICD-10-CM | POA: Diagnosis not present

## 2022-11-23 DIAGNOSIS — H04123 Dry eye syndrome of bilateral lacrimal glands: Secondary | ICD-10-CM | POA: Diagnosis not present

## 2022-11-23 DIAGNOSIS — H43813 Vitreous degeneration, bilateral: Secondary | ICD-10-CM | POA: Diagnosis not present

## 2022-11-23 DIAGNOSIS — H1045 Other chronic allergic conjunctivitis: Secondary | ICD-10-CM | POA: Diagnosis not present

## 2022-11-26 DIAGNOSIS — K5730 Diverticulosis of large intestine without perforation or abscess without bleeding: Secondary | ICD-10-CM | POA: Diagnosis not present

## 2022-11-26 DIAGNOSIS — K648 Other hemorrhoids: Secondary | ICD-10-CM | POA: Diagnosis not present

## 2022-11-26 DIAGNOSIS — K624 Stenosis of anus and rectum: Secondary | ICD-10-CM | POA: Diagnosis not present

## 2022-11-26 DIAGNOSIS — D123 Benign neoplasm of transverse colon: Secondary | ICD-10-CM | POA: Diagnosis not present

## 2022-11-26 DIAGNOSIS — Z1211 Encounter for screening for malignant neoplasm of colon: Secondary | ICD-10-CM | POA: Diagnosis not present

## 2022-11-30 DIAGNOSIS — D123 Benign neoplasm of transverse colon: Secondary | ICD-10-CM | POA: Diagnosis not present

## 2023-02-18 DIAGNOSIS — J40 Bronchitis, not specified as acute or chronic: Secondary | ICD-10-CM | POA: Diagnosis not present

## 2023-02-25 DIAGNOSIS — Z1389 Encounter for screening for other disorder: Secondary | ICD-10-CM | POA: Diagnosis not present

## 2023-02-25 DIAGNOSIS — Z125 Encounter for screening for malignant neoplasm of prostate: Secondary | ICD-10-CM | POA: Diagnosis not present

## 2023-02-25 DIAGNOSIS — M519 Unspecified thoracic, thoracolumbar and lumbosacral intervertebral disc disorder: Secondary | ICD-10-CM | POA: Diagnosis not present

## 2023-02-25 DIAGNOSIS — Z0000 Encounter for general adult medical examination without abnormal findings: Secondary | ICD-10-CM | POA: Diagnosis not present

## 2023-02-25 DIAGNOSIS — R7303 Prediabetes: Secondary | ICD-10-CM | POA: Diagnosis not present

## 2023-02-25 DIAGNOSIS — E782 Mixed hyperlipidemia: Secondary | ICD-10-CM | POA: Diagnosis not present

## 2023-02-25 DIAGNOSIS — I10 Essential (primary) hypertension: Secondary | ICD-10-CM | POA: Diagnosis not present

## 2023-02-25 DIAGNOSIS — J309 Allergic rhinitis, unspecified: Secondary | ICD-10-CM | POA: Diagnosis not present

## 2023-04-01 ENCOUNTER — Other Ambulatory Visit: Payer: Self-pay | Admitting: Internal Medicine

## 2023-04-01 ENCOUNTER — Ambulatory Visit
Admission: RE | Admit: 2023-04-01 | Discharge: 2023-04-01 | Disposition: A | Discharge status: Home / Self Care | Payer: Medicare Other | Source: Ambulatory Visit | Attending: Internal Medicine | Admitting: Internal Medicine

## 2023-04-01 DIAGNOSIS — R0789 Other chest pain: Secondary | ICD-10-CM | POA: Diagnosis not present

## 2023-04-01 DIAGNOSIS — R0781 Pleurodynia: Secondary | ICD-10-CM | POA: Diagnosis not present

## 2023-04-11 DIAGNOSIS — Z85820 Personal history of malignant melanoma of skin: Secondary | ICD-10-CM | POA: Diagnosis not present

## 2023-04-11 DIAGNOSIS — D1801 Hemangioma of skin and subcutaneous tissue: Secondary | ICD-10-CM | POA: Diagnosis not present

## 2023-04-11 DIAGNOSIS — Z85828 Personal history of other malignant neoplasm of skin: Secondary | ICD-10-CM | POA: Diagnosis not present

## 2023-04-11 DIAGNOSIS — L821 Other seborrheic keratosis: Secondary | ICD-10-CM | POA: Diagnosis not present

## 2023-07-09 DIAGNOSIS — Z23 Encounter for immunization: Secondary | ICD-10-CM | POA: Diagnosis not present

## 2023-08-26 DIAGNOSIS — Z23 Encounter for immunization: Secondary | ICD-10-CM | POA: Diagnosis not present

## 2023-11-30 DIAGNOSIS — H04123 Dry eye syndrome of bilateral lacrimal glands: Secondary | ICD-10-CM | POA: Diagnosis not present

## 2023-11-30 DIAGNOSIS — H25813 Combined forms of age-related cataract, bilateral: Secondary | ICD-10-CM | POA: Diagnosis not present

## 2023-11-30 DIAGNOSIS — H1045 Other chronic allergic conjunctivitis: Secondary | ICD-10-CM | POA: Diagnosis not present

## 2023-11-30 DIAGNOSIS — H43813 Vitreous degeneration, bilateral: Secondary | ICD-10-CM | POA: Diagnosis not present

## 2024-02-21 DIAGNOSIS — J069 Acute upper respiratory infection, unspecified: Secondary | ICD-10-CM | POA: Diagnosis not present

## 2024-02-21 DIAGNOSIS — J40 Bronchitis, not specified as acute or chronic: Secondary | ICD-10-CM | POA: Diagnosis not present

## 2024-02-21 DIAGNOSIS — R051 Acute cough: Secondary | ICD-10-CM | POA: Diagnosis not present

## 2024-02-29 DIAGNOSIS — R7303 Prediabetes: Secondary | ICD-10-CM | POA: Diagnosis not present

## 2024-02-29 DIAGNOSIS — E782 Mixed hyperlipidemia: Secondary | ICD-10-CM | POA: Diagnosis not present

## 2024-02-29 DIAGNOSIS — I10 Essential (primary) hypertension: Secondary | ICD-10-CM | POA: Diagnosis not present

## 2024-02-29 DIAGNOSIS — Z1389 Encounter for screening for other disorder: Secondary | ICD-10-CM | POA: Diagnosis not present

## 2024-02-29 DIAGNOSIS — R059 Cough, unspecified: Secondary | ICD-10-CM | POA: Diagnosis not present

## 2024-02-29 DIAGNOSIS — Z0000 Encounter for general adult medical examination without abnormal findings: Secondary | ICD-10-CM | POA: Diagnosis not present

## 2024-02-29 DIAGNOSIS — J309 Allergic rhinitis, unspecified: Secondary | ICD-10-CM | POA: Diagnosis not present

## 2024-02-29 DIAGNOSIS — Z125 Encounter for screening for malignant neoplasm of prostate: Secondary | ICD-10-CM | POA: Diagnosis not present

## 2024-02-29 DIAGNOSIS — M519 Unspecified thoracic, thoracolumbar and lumbosacral intervertebral disc disorder: Secondary | ICD-10-CM | POA: Diagnosis not present

## 2024-02-29 DIAGNOSIS — Z23 Encounter for immunization: Secondary | ICD-10-CM | POA: Diagnosis not present

## 2024-02-29 DIAGNOSIS — C4359 Malignant melanoma of other part of trunk: Secondary | ICD-10-CM | POA: Diagnosis not present

## 2024-04-24 DIAGNOSIS — L821 Other seborrheic keratosis: Secondary | ICD-10-CM | POA: Diagnosis not present

## 2024-04-24 DIAGNOSIS — Z85820 Personal history of malignant melanoma of skin: Secondary | ICD-10-CM | POA: Diagnosis not present

## 2024-04-24 DIAGNOSIS — L820 Inflamed seborrheic keratosis: Secondary | ICD-10-CM | POA: Diagnosis not present

## 2024-04-24 DIAGNOSIS — D1801 Hemangioma of skin and subcutaneous tissue: Secondary | ICD-10-CM | POA: Diagnosis not present

## 2024-04-24 DIAGNOSIS — L570 Actinic keratosis: Secondary | ICD-10-CM | POA: Diagnosis not present

## 2024-04-24 DIAGNOSIS — L812 Freckles: Secondary | ICD-10-CM | POA: Diagnosis not present

## 2024-04-24 DIAGNOSIS — D485 Neoplasm of uncertain behavior of skin: Secondary | ICD-10-CM | POA: Diagnosis not present

## 2024-04-24 DIAGNOSIS — Z85828 Personal history of other malignant neoplasm of skin: Secondary | ICD-10-CM | POA: Diagnosis not present

## 2024-05-08 DIAGNOSIS — R059 Cough, unspecified: Secondary | ICD-10-CM | POA: Diagnosis not present

## 2024-05-08 DIAGNOSIS — I700 Atherosclerosis of aorta: Secondary | ICD-10-CM | POA: Diagnosis not present

## 2024-07-12 ENCOUNTER — Other Ambulatory Visit (HOSPITAL_COMMUNITY): Payer: Self-pay | Admitting: Internal Medicine

## 2024-07-12 DIAGNOSIS — M545 Low back pain, unspecified: Secondary | ICD-10-CM | POA: Diagnosis not present

## 2024-07-12 DIAGNOSIS — M48062 Spinal stenosis, lumbar region with neurogenic claudication: Secondary | ICD-10-CM | POA: Diagnosis not present

## 2024-07-12 DIAGNOSIS — M5116 Intervertebral disc disorders with radiculopathy, lumbar region: Secondary | ICD-10-CM | POA: Diagnosis not present

## 2024-07-13 ENCOUNTER — Ambulatory Visit (HOSPITAL_COMMUNITY)
Admission: RE | Admit: 2024-07-13 | Discharge: 2024-07-13 | Disposition: A | Discharge status: Home / Self Care | Source: Ambulatory Visit | Attending: Internal Medicine | Admitting: Internal Medicine

## 2024-07-13 DIAGNOSIS — M48062 Spinal stenosis, lumbar region with neurogenic claudication: Secondary | ICD-10-CM | POA: Diagnosis not present

## 2024-07-13 DIAGNOSIS — M47816 Spondylosis without myelopathy or radiculopathy, lumbar region: Secondary | ICD-10-CM | POA: Diagnosis not present

## 2024-07-13 DIAGNOSIS — M48061 Spinal stenosis, lumbar region without neurogenic claudication: Secondary | ICD-10-CM | POA: Diagnosis not present

## 2024-07-13 DIAGNOSIS — M5126 Other intervertebral disc displacement, lumbar region: Secondary | ICD-10-CM | POA: Diagnosis not present

## 2024-07-13 DIAGNOSIS — M51369 Other intervertebral disc degeneration, lumbar region without mention of lumbar back pain or lower extremity pain: Secondary | ICD-10-CM | POA: Diagnosis not present

## 2024-07-19 DIAGNOSIS — L570 Actinic keratosis: Secondary | ICD-10-CM | POA: Diagnosis not present

## 2024-07-19 DIAGNOSIS — Z85820 Personal history of malignant melanoma of skin: Secondary | ICD-10-CM | POA: Diagnosis not present

## 2024-07-19 DIAGNOSIS — Z85828 Personal history of other malignant neoplasm of skin: Secondary | ICD-10-CM | POA: Diagnosis not present

## 2024-07-24 DIAGNOSIS — M48062 Spinal stenosis, lumbar region with neurogenic claudication: Secondary | ICD-10-CM | POA: Diagnosis not present

## 2024-07-24 DIAGNOSIS — S32040D Wedge compression fracture of fourth lumbar vertebra, subsequent encounter for fracture with routine healing: Secondary | ICD-10-CM | POA: Diagnosis not present

## 2024-07-24 DIAGNOSIS — Z6827 Body mass index (BMI) 27.0-27.9, adult: Secondary | ICD-10-CM | POA: Diagnosis not present

## 2024-08-14 DIAGNOSIS — M545 Low back pain, unspecified: Secondary | ICD-10-CM | POA: Diagnosis not present

## 2024-08-16 DIAGNOSIS — Z23 Encounter for immunization: Secondary | ICD-10-CM | POA: Diagnosis not present

## 2024-08-21 DIAGNOSIS — Z23 Encounter for immunization: Secondary | ICD-10-CM | POA: Diagnosis not present

## 2024-09-21 DIAGNOSIS — M4316 Spondylolisthesis, lumbar region: Secondary | ICD-10-CM | POA: Diagnosis not present

## 2024-09-21 DIAGNOSIS — S32040D Wedge compression fracture of fourth lumbar vertebra, subsequent encounter for fracture with routine healing: Secondary | ICD-10-CM | POA: Diagnosis not present

## 2024-09-21 DIAGNOSIS — Z6827 Body mass index (BMI) 27.0-27.9, adult: Secondary | ICD-10-CM | POA: Diagnosis not present

## 2024-09-21 DIAGNOSIS — M48062 Spinal stenosis, lumbar region with neurogenic claudication: Secondary | ICD-10-CM | POA: Diagnosis not present
